# Patient Record
Sex: Male | Born: 1980 | Race: White | Hispanic: No | Smoking: Former smoker
Health system: Southern US, Community
[De-identification: ages and names within clinical notes are randomized; demographics above are authoritative.]

## PROBLEM LIST (undated history)

## (undated) DIAGNOSIS — J449 Chronic obstructive pulmonary disease, unspecified: Secondary | ICD-10-CM

## (undated) DIAGNOSIS — J45909 Unspecified asthma, uncomplicated: Secondary | ICD-10-CM

---

## 2006-11-12 ENCOUNTER — Emergency Department: Payer: Self-pay

## 2007-02-04 ENCOUNTER — Emergency Department: Payer: Self-pay | Admitting: Unknown Physician Specialty

## 2020-12-10 ENCOUNTER — Encounter (HOSPITAL_COMMUNITY): Payer: Self-pay | Admitting: Emergency Medicine

## 2020-12-10 ENCOUNTER — Emergency Department (HOSPITAL_COMMUNITY): Payer: No Typology Code available for payment source

## 2020-12-10 ENCOUNTER — Emergency Department (HOSPITAL_COMMUNITY)
Admission: EM | Admit: 2020-12-10 | Discharge: 2020-12-10 | Disposition: A | Discharge status: Home / Self Care | Payer: No Typology Code available for payment source | Attending: Emergency Medicine | Admitting: Emergency Medicine

## 2020-12-10 DIAGNOSIS — Z20822 Contact with and (suspected) exposure to covid-19: Secondary | ICD-10-CM | POA: Insufficient documentation

## 2020-12-10 DIAGNOSIS — T1490XA Injury, unspecified, initial encounter: Secondary | ICD-10-CM

## 2020-12-10 DIAGNOSIS — R519 Headache, unspecified: Secondary | ICD-10-CM | POA: Diagnosis not present

## 2020-12-10 DIAGNOSIS — Y92410 Unspecified street and highway as the place of occurrence of the external cause: Secondary | ICD-10-CM | POA: Insufficient documentation

## 2020-12-10 DIAGNOSIS — J45909 Unspecified asthma, uncomplicated: Secondary | ICD-10-CM | POA: Insufficient documentation

## 2020-12-10 HISTORY — DX: Unspecified asthma, uncomplicated: J45.909

## 2020-12-10 LAB — CBC
HCT: 42.9 % (ref 39.0–52.0)
Hemoglobin: 14.2 g/dL (ref 13.0–17.0)
MCH: 30.7 pg (ref 26.0–34.0)
MCHC: 33.1 g/dL (ref 30.0–36.0)
MCV: 92.9 fL (ref 80.0–100.0)
Platelets: 346 10*3/uL (ref 150–400)
RBC: 4.62 MIL/uL (ref 4.22–5.81)
RDW: 12.4 % (ref 11.5–15.5)
WBC: 7.9 10*3/uL (ref 4.0–10.5)
nRBC: 0 % (ref 0.0–0.2)

## 2020-12-10 LAB — I-STAT CHEM 8, ED
BUN: 21 mg/dL — ABNORMAL HIGH (ref 6–20)
Calcium, Ion: 1.16 mmol/L (ref 1.15–1.40)
Chloride: 100 mmol/L (ref 98–111)
Creatinine, Ser: 1 mg/dL (ref 0.61–1.24)
Glucose, Bld: 104 mg/dL — ABNORMAL HIGH (ref 70–99)
HCT: 42 % (ref 39.0–52.0)
Hemoglobin: 14.3 g/dL (ref 13.0–17.0)
Potassium: 3.6 mmol/L (ref 3.5–5.1)
Sodium: 138 mmol/L (ref 135–145)
TCO2: 23 mmol/L (ref 22–32)

## 2020-12-10 LAB — COMPREHENSIVE METABOLIC PANEL
ALT: 58 U/L — ABNORMAL HIGH (ref 0–44)
AST: 39 U/L (ref 15–41)
Albumin: 3.8 g/dL (ref 3.5–5.0)
Alkaline Phosphatase: 61 U/L (ref 38–126)
Anion gap: 12 (ref 5–15)
BUN: 19 mg/dL (ref 6–20)
CO2: 24 mmol/L (ref 22–32)
Calcium: 9.2 mg/dL (ref 8.9–10.3)
Chloride: 101 mmol/L (ref 98–111)
Creatinine, Ser: 1.13 mg/dL (ref 0.61–1.24)
GFR, Estimated: >60 mL/min (ref >60)
Glucose, Bld: 104 mg/dL — ABNORMAL HIGH (ref 70–99)
Potassium: 3.6 mmol/L (ref 3.5–5.1)
Sodium: 137 mmol/L (ref 135–145)
Total Bilirubin: 1 mg/dL (ref 0.3–1.2)
Total Protein: 6.6 g/dL (ref 6.5–8.1)

## 2020-12-10 LAB — LACTIC ACID, PLASMA
Lactic Acid, Venous: 5.6 mmol/L (ref 0.5–1.9)
Lactic Acid, Venous: 1.2 mmol/L (ref 0.5–1.9)

## 2020-12-10 LAB — ETHANOL: Alcohol, Ethyl (B): <10 mg/dL (ref <10)

## 2020-12-10 LAB — PROTIME-INR
INR: 1 (ref 0.8–1.2)
Prothrombin Time: 12.4 seconds (ref 11.4–15.2)

## 2020-12-10 LAB — SAMPLE TO BLOOD BANK

## 2020-12-10 LAB — RESP PANEL BY RT-PCR (FLU A&B, COVID) ARPGX2
Influenza A by PCR: NEGATIVE
Influenza B by PCR: NEGATIVE
SARS Coronavirus 2 by RT PCR: NEGATIVE

## 2020-12-10 MED ORDER — TETANUS-DIPHTH-ACELL PERTUSSIS 5-2.5-18.5 LF-MCG/0.5 IM SUSY
0.5000 mL | PREFILLED_SYRINGE | Freq: Once | INTRAMUSCULAR | Status: DC
  Filled 2020-12-10: qty 0.5

## 2020-12-10 MED ORDER — ONDANSETRON HCL 4 MG/2ML IJ SOLN: 4.0000 mg | Freq: Once | INTRAMUSCULAR | Status: AC

## 2020-12-10 MED ORDER — MORPHINE SULFATE (PF) 4 MG/ML IV SOLN
INTRAVENOUS | Status: AC
  Administered 2020-12-10: 4 mg via INTRAVENOUS
  Filled 2020-12-10: qty 1

## 2020-12-10 MED ORDER — ACETAMINOPHEN 500 MG PO TABS: 500.0000 mg | ORAL_TABLET | Freq: Four times a day (QID) | ORAL | 0 refills | Status: DC | PRN

## 2020-12-10 MED ORDER — SODIUM CHLORIDE 0.9 % IV BOLUS
1000.0000 mL | Freq: Once | INTRAVENOUS | Status: AC
  Administered 2020-12-10: 1000 mL via INTRAVENOUS

## 2020-12-10 MED ORDER — SODIUM CHLORIDE 0.9 % IV SOLN: INTRAVENOUS | Status: DC

## 2020-12-10 MED ORDER — ONDANSETRON HCL 4 MG/2ML IJ SOLN
INTRAMUSCULAR | Status: AC
  Administered 2020-12-10: 4 mg via INTRAVENOUS
  Filled 2020-12-10: qty 2

## 2020-12-10 MED ORDER — IOHEXOL 300 MG/ML  SOLN
100.0000 mL | Freq: Once | INTRAMUSCULAR | Status: AC | PRN
  Administered 2020-12-10: 100 mL via INTRAVENOUS

## 2020-12-10 MED ORDER — MORPHINE SULFATE (PF) 4 MG/ML IV SOLN
4.0000 mg | Freq: Once | INTRAVENOUS | Status: AC
Start: 2020-12-10 — End: 2020-12-10
  Administered 2020-12-10: 4 mg via INTRAVENOUS
  Filled 2020-12-10: qty 1

## 2020-12-10 MED ORDER — METHOCARBAMOL 500 MG PO TABS: 500.0000 mg | ORAL_TABLET | Freq: Two times a day (BID) | ORAL | 0 refills | Status: DC

## 2020-12-10 MED ORDER — MORPHINE SULFATE (PF) 4 MG/ML IV SOLN
4.0000 mg | Freq: Once | INTRAVENOUS | Status: AC
Start: 2020-12-10 — End: 2020-12-10

## 2020-12-10 NOTE — ED Notes (Signed)
Patient able to tolerate PO during fluid challenge.
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
## 2020-12-10 NOTE — ED Notes (Signed)
Dr Silverio Lay informed of patient wanting to leave.
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
## 2020-12-10 NOTE — ED Notes (Signed)
Pt ambulated in hall without the need of any assistance.

## 2020-12-10 NOTE — ED Notes (Signed)
[employer] took belongings from room.

## 2021-11-19 ENCOUNTER — Emergency Department: Payer: Self-pay

## 2021-11-19 ENCOUNTER — Other Ambulatory Visit: Payer: Self-pay

## 2021-11-19 ENCOUNTER — Encounter: Payer: Self-pay | Admitting: Emergency Medicine

## 2021-11-19 ENCOUNTER — Inpatient Hospital Stay
Admission: EM | Admit: 2021-11-19 | Discharge: 2021-11-20 | DRG: 191 | Discharge status: Against Medical Advice | Payer: Self-pay | Attending: Internal Medicine | Admitting: Internal Medicine

## 2021-11-19 DIAGNOSIS — R7989 Other specified abnormal findings of blood chemistry: Secondary | ICD-10-CM | POA: Diagnosis present

## 2021-11-19 DIAGNOSIS — Z5329 Procedure and treatment not carried out because of patient's decision for other reasons: Secondary | ICD-10-CM | POA: Diagnosis not present

## 2021-11-19 DIAGNOSIS — J9611 Chronic respiratory failure with hypoxia: Secondary | ICD-10-CM | POA: Diagnosis present

## 2021-11-19 DIAGNOSIS — J441 Chronic obstructive pulmonary disease with (acute) exacerbation: Principal | ICD-10-CM | POA: Diagnosis present

## 2021-11-19 DIAGNOSIS — Z20822 Contact with and (suspected) exposure to covid-19: Secondary | ICD-10-CM | POA: Diagnosis present

## 2021-11-19 DIAGNOSIS — N19 Unspecified kidney failure: Secondary | ICD-10-CM | POA: Diagnosis present

## 2021-11-19 DIAGNOSIS — Z888 Allergy status to other drugs, medicaments and biological substances status: Secondary | ICD-10-CM

## 2021-11-19 DIAGNOSIS — Z9981 Dependence on supplemental oxygen: Secondary | ICD-10-CM

## 2021-11-19 DIAGNOSIS — D72829 Elevated white blood cell count, unspecified: Secondary | ICD-10-CM | POA: Diagnosis present

## 2021-11-19 DIAGNOSIS — Z880 Allergy status to penicillin: Secondary | ICD-10-CM

## 2021-11-19 DIAGNOSIS — Z79899 Other long term (current) drug therapy: Secondary | ICD-10-CM

## 2021-11-19 DIAGNOSIS — Z87891 Personal history of nicotine dependence: Secondary | ICD-10-CM

## 2021-11-19 DIAGNOSIS — R0602 Shortness of breath: Secondary | ICD-10-CM | POA: Diagnosis present

## 2021-11-19 HISTORY — DX: Chronic obstructive pulmonary disease, unspecified: J44.9

## 2021-11-19 LAB — CBC WITH DIFFERENTIAL/PLATELET
Abs Immature Granulocytes: 0.05 10*3/uL (ref 0.00–0.07)
Basophils Absolute: 0.1 10*3/uL (ref 0.0–0.1)
Basophils Relative: 0 %
Eosinophils Absolute: 1.4 10*3/uL — ABNORMAL HIGH (ref 0.0–0.5)
Eosinophils Relative: 10 %
HCT: 44.2 % (ref 39.0–52.0)
Hemoglobin: 14 g/dL (ref 13.0–17.0)
Immature Granulocytes: 0 %
Lymphocytes Relative: 10 %
Lymphs Abs: 1.5 10*3/uL (ref 0.7–4.0)
MCH: 29.4 pg (ref 26.0–34.0)
MCHC: 31.7 g/dL (ref 30.0–36.0)
MCV: 92.9 fL (ref 80.0–100.0)
Monocytes Absolute: 0.8 10*3/uL (ref 0.1–1.0)
Monocytes Relative: 5 %
Neutro Abs: 10.7 10*3/uL — ABNORMAL HIGH (ref 1.7–7.7)
Neutrophils Relative %: 75 %
Platelets: 368 10*3/uL (ref 150–400)
RBC: 4.76 MIL/uL (ref 4.22–5.81)
RDW: 13.1 % (ref 11.5–15.5)
WBC: 14.5 10*3/uL — ABNORMAL HIGH (ref 4.0–10.5)
nRBC: 0 % (ref 0.0–0.2)

## 2021-11-19 LAB — BASIC METABOLIC PANEL
Anion gap: 8 (ref 5–15)
BUN: 24 mg/dL — ABNORMAL HIGH (ref 6–20)
CO2: 29 mmol/L (ref 22–32)
Calcium: 9 mg/dL (ref 8.9–10.3)
Chloride: 100 mmol/L (ref 98–111)
Creatinine, Ser: 0.66 mg/dL (ref 0.61–1.24)
GFR, Estimated: >60 mL/min (ref >60)
Glucose, Bld: 106 mg/dL — ABNORMAL HIGH (ref 70–99)
Potassium: 4.2 mmol/L (ref 3.5–5.1)
Sodium: 137 mmol/L (ref 135–145)

## 2021-11-19 LAB — MAGNESIUM: Magnesium: 2.4 mg/dL (ref 1.7–2.4)

## 2021-11-19 LAB — RESP PANEL BY RT-PCR (FLU A&B, COVID) ARPGX2
Influenza A by PCR: NEGATIVE
Influenza B by PCR: NEGATIVE
SARS Coronavirus 2 by RT PCR: NEGATIVE

## 2021-11-19 MED ORDER — DEXAMETHASONE SODIUM PHOSPHATE 10 MG/ML IJ SOLN
10.0000 mg | Freq: Once | INTRAMUSCULAR | Status: AC
Start: 2021-11-19 — End: 2021-11-19
  Administered 2021-11-19: 10 mg via INTRAMUSCULAR
  Filled 2021-11-19: qty 1

## 2021-11-19 MED ORDER — IPRATROPIUM-ALBUTEROL 0.5-2.5 (3) MG/3ML IN SOLN
3.0000 mL | Freq: Once | RESPIRATORY_TRACT | Status: AC
  Administered 2021-11-19: 3 mL via RESPIRATORY_TRACT
  Filled 2021-11-19: qty 3

## 2021-11-19 MED ORDER — ALBUTEROL SULFATE (2.5 MG/3ML) 0.083% IN NEBU
5.0000 mg | INHALATION_SOLUTION | Freq: Once | RESPIRATORY_TRACT | Status: DC
  Filled 2021-11-19: qty 6

## 2021-11-19 MED ORDER — SODIUM CHLORIDE 0.9 % IV BOLUS
1000.0000 mL | Freq: Once | INTRAVENOUS | Status: AC
  Administered 2021-11-19: 1000 mL via INTRAVENOUS

## 2021-11-19 MED ORDER — IPRATROPIUM-ALBUTEROL 0.5-2.5 (3) MG/3ML IN SOLN
3.0000 mL | Freq: Once | RESPIRATORY_TRACT | Status: AC
  Administered 2021-11-20: 3 mL via RESPIRATORY_TRACT
  Filled 2021-11-19: qty 3

## 2021-11-19 MED ORDER — ACETAMINOPHEN 325 MG RE SUPP: 650.0000 mg | Freq: Four times a day (QID) | RECTAL | Status: DC | PRN

## 2021-11-19 MED ORDER — ACETAMINOPHEN 325 MG PO TABS: 650.0000 mg | ORAL_TABLET | Freq: Four times a day (QID) | ORAL | Status: DC | PRN

## 2021-11-19 MED ORDER — IPRATROPIUM-ALBUTEROL 0.5-2.5 (3) MG/3ML IN SOLN: 3.0000 mL | Freq: Four times a day (QID) | RESPIRATORY_TRACT | Status: DC

## 2021-11-19 MED ORDER — SODIUM CHLORIDE 0.9 % IV SOLN: 500.0000 mg | INTRAVENOUS | Status: DC

## 2021-11-19 MED ORDER — METHYLPREDNISOLONE SODIUM SUCC 125 MG IJ SOLR: 80.0000 mg | Freq: Two times a day (BID) | INTRAMUSCULAR | Status: DC

## 2021-11-19 MED ORDER — ALBUTEROL SULFATE (2.5 MG/3ML) 0.083% IN NEBU
2.5000 mg | INHALATION_SOLUTION | RESPIRATORY_TRACT | Status: DC | PRN
Start: 2021-11-19 — End: 2021-11-20

## 2021-11-19 MED ORDER — MAGNESIUM SULFATE 2 GM/50ML IV SOLN
2.0000 g | Freq: Once | INTRAVENOUS | Status: DC
Start: 2021-11-19 — End: 2021-11-20
  Filled 2021-11-19: qty 50

## 2021-11-19 NOTE — ED Provider Triage Note (Signed)
Emergency Medicine Provider Triage Evaluation Note  Alex Conner , a 41 y.o. male  was evaluated in triage.  Pt complains of shortness of breath, patient wears O2 at home..  Review of Systems  Positive: Shortness of breath Negative: Fever  Physical Exam  There were no vitals taken for this visit. Gen:   Awake, mild respiratory distress Resp:  Increased respiratory effort, positive audible wheezing, wheezing noted all lung fields MSK:   Moves extremities without difficulty  Other:    Medical Decision Making  Medically screening exam initiated at 6:42 PM.  Appropriate orders placed.  Alex Conner was informed that the remainder of the evaluation will be completed by another provider, this initial triage assessment does not replace that evaluation, and the importance of remaining in the ED until their evaluation is complete.  3 DuoNebs, Decadron 10 mg IM   Faythe Ghee, PA-C 11/19/21 1843

## 2021-11-19 NOTE — ED Triage Notes (Signed)
Pt via POV from home. Pt on arrival expiratory wheezing and SOB. Pt states its been going on for a while and this past couple it got worse. Denies pain. Pt is A&OX4    Alex Pikes PA in triage. Pt wear 3L [place] chronically.

## 2021-11-19 NOTE — ED Notes (Signed)
Pt yelling cussing, states  he wants a blanket and has been ringing for 5 minutes for a blanket. Pt continues to cuss and yell. Pt informed no need to swear and the nurses are busy with life saving procedures. Pt provided with two warm blankets.

## 2021-11-19 NOTE — H&P (Addendum)
History and Physical    PLEASE NOTE THAT DRAGON DICTATION SOFTWARE WAS USED IN THE CONSTRUCTION OF THIS NOTE.   Alex Conner [id number] DOB: 1981/01/01 DOA: 11/19/2021  PCP: Pcp, No (will further assess) Patient coming from: home   I have personally briefly reviewed patient's old medical records in [place]  Chief Complaint: Shortness of breath  HPI: Alex Conner is a 41 y.o. male with medical history significant for chronic hypoxic respiratory failure on 2 to 3 L continuous nasal cannula, severe COPD, who is admitted to [facility] on 11/19/2021 with suspected acute COPD exacerbation after presenting from home to [facility] ED complaining of shortness of breath.   The patient reports 4 to 5 days of progressive shortness of breath associated with new onset wheezing and nonproductive cough in the absence of any associated hemoptysis. Denies any associated orthopnea, PND, or new onset peripheral edema. No recent chest pain, diaphoresis, palpitations, N/V, pre-syncope, or syncope. No new lower extremity erythema, or calf tenderness. Denies any recent trauma, travel, surgical procedures, or periods of prolonged diminished ambulatory status. No recent melena or hematochezia.   Denies any associated subjective fever, chills, rigors, or generalized myalgias. No recent headache, neck stiffness, rhinitis, rhinorrhea, sore throat, abdominal pain, diarrhea, or rash. No known recent COVID-19 exposures. Denies dysuria, gross hematuria, or change in urinary urgency/frequency.   He confirms a history of chronic hypoxic respiratory failure on 2 to 3 L continuous nasal cannula as an outpatient.  He also conveys that his outpatient respiratory regimen consists of prn albuterol inhaler in the absence of any scheduled respiratory medications.  He conveys that he is a former smoker.      ED Course:  Vital signs in the ED were notable for the following: Afebrile; heart rate 71-78; blood pressure 117/86;  respiratory rate 18-22, oxygen saturation 99% on his baseline 2 to 3 L nasal cannula.  Labs were notable for the following: BMP notable for the following: Sodium 137, bicarbonate 29, creatinine 0.66, BUN to creatinine ratio 36.4, glucose 106.  CBC notable for white blood cell count 14,500, hemoglobin 14.  COVID-19/influenza PCR negative  Imaging and additional notable ED work-up: Chest x-ray shows mild bronchitic changes with minimal left basilar atelectasis, without evidence of infiltrate, edema effusion, or pneumothorax.  While in the ED, the following were administered: Decadron 10 mg IM x1, duo nebulizer treatment x1, normal saline x1 L bolus, magnesium sulfate 2 g IV every 2 hours x1 dose.  Subsequently, the patient was admitted for further evaluation management of suspected presenting acute COPD exacerbation.      Review of Systems: As per HPI otherwise 10 point review of systems negative.   Past Medical History:  Diagnosis Date   Asthma    COPD (chronic obstructive pulmonary disease) ([place])     History reviewed. No pertinent surgical history.  Social History:  reports that he has quit smoking. His smoking use included cigarettes. He has never used smokeless tobacco. He reports that he does not currently use alcohol. He reports that he does not currently use drugs.   Allergies  Allergen Reactions   Nsaids Hives   Penicillins Other (See Comments)    Per pt: causes his asthma to flare up    History reviewed. No pertinent family history.  Family history reviewed and not pertinent    Prior to Admission medications   Medication Sig Start Date End Date Taking? Authorizing Provider  albuterol (VENTOLIN HFA) 108 (90 Base) MCG/ACT inhaler SMARTSIG:2 Puff(s) By  Mouth Every 6 Hours PRN 10/28/21  Yes [provider]  carvedilol (COREG) 3.125 MG tablet Take 3.125 mg by mouth 2 (two) times daily. Patient not taking: Reported on 11/19/2021 09/23/21   [provider]   lisinopril (ZESTRIL) 5 MG tablet Take 5 mg by mouth daily. Patient not taking: Reported on 11/19/2021 09/23/21   [provider]  predniSONE (DELTASONE) 20 MG tablet Take 40 mg by mouth every morning. Patient not taking: Reported on 11/19/2021 10/28/21   [provider]     Objective    Physical Exam: Vitals:   11/19/21 1841 11/19/21 1851 11/19/21 1900  BP: (!) 120/59  117/86  Pulse: 71  78  Resp: (!) 22  18  TempSrc: Axillary    SpO2: 92%  99%  Weight:  72.6 kg   Height:  6\' 1"  (1.854 m)     General: appears to be stated age; alert, oriented; increased work of breathing noted Skin: warm, dry, no rash Head:  AT/ Mouth:  Oral mucosa membranes appear dry, normal dentition Neck: supple; trachea midline Heart:  RRR; did not appreciate any M/R/G Lungs: Bilateral expiratory wheezes noted, but otherwise CTAB, did not appreciate any rales, or rhonchi Abdomen: + BS; soft, ND, NT Vascular: 2+ pedal pulses b/l; 2+ radial pulses b/l Extremities: no peripheral edema, no muscle wasting Neuro: strength and sensation intact in upper and lower extremities b/l     Labs on Admission: I have personally reviewed following labs and imaging studies  CBC: Recent Labs  Lab 11/19/21 1903  WBC 14.5*  NEUTROABS 10.7*  HGB 14.0  HCT 44.2  MCV 92.9  PLT 123XX123   Basic Metabolic Panel: Recent Labs  Lab 11/19/21 1856 11/19/21 1903  NA  --  137  K  --  4.2  CL  --  100  CO2  --  29  GLUCOSE  --  106*  BUN  --  24*  CREATININE  --  0.66  CALCIUM  --  9.0  MG 2.4  --    GFR: Estimated Creatinine Clearance: 126 mL/min (by C-G formula based on SCr of 0.66 mg/dL). Liver Function Tests: No results for input(s): AST, ALT, ALKPHOS, BILITOT, PROT, ALBUMIN in the last 168 hours. No results for input(s): LIPASE, AMYLASE in the last 168 hours. No results for input(s): AMMONIA in the last 168 hours. Coagulation Profile: No results for input(s): INR, PROTIME in the last 168  hours. Cardiac Enzymes: No results for input(s): CKTOTAL, CKMB, CKMBINDEX, TROPONINI in the last 168 hours. BNP (last 3 results) No results for input(s): PROBNP in the last 8760 hours. HbA1C: No results for input(s): HGBA1C in the last 72 hours. CBG: No results for input(s): GLUCAP in the last 168 hours. Lipid Profile: No results for input(s): CHOL, HDL, LDLCALC, TRIG, CHOLHDL, LDLDIRECT in the last 72 hours. Thyroid Function Tests: No results for input(s): TSH, T4TOTAL, FREET4, T3FREE, THYROIDAB in the last 72 hours. Anemia Panel: No results for input(s): VITAMINB12, FOLATE, FERRITIN, TIBC, IRON, RETICCTPCT in the last 72 hours. Urine analysis: No results found for: COLORURINE, APPEARANCEUR, [place], [place], [place], [place], BILIRUBINUR, KETONESUR, PROTEINUR, UROBILINOGEN, NITRITE, LEUKOCYTESUR  Radiological Exams on Admission: DG Chest Portable 1 View  Result Date: 11/19/2021 CLINICAL DATA:  Wheezing, shortness of breath EXAM: PORTABLE CHEST 1 VIEW COMPARISON:  12/10/2020 FINDINGS: Heart is normal size. Mild peribronchial thickening. Minimal left base atelectasis. No effusions or acute bony abnormality. IMPRESSION: Mild bronchitic changes.  Left base atelectasis. Electronically Signed   By:  Rolm Baptise M.D.   On: 11/19/2021 19:18     Assessment/Plan    Principal Problem:   Acute exacerbation of chronic obstructive pulmonary disease (COPD) (HCC) Active Problems:   SOB (shortness of breath)   Chronic respiratory failure with hypoxia (HCC)   Acute prerenal azotemia   Leukocytosis      #) Acute COPD exacerbation: in the context of a documented history of chronic hypoxic respiratory failure on 2 to 3 L nasal cannula in the setting of severe COPD, diagnosis of acute exacerbation on the basis of 4 to 5 days of progressive shortness of breath associated with wheezing, new onset nonproductive cough, evidence of increased work of breathing, with presenting CXR showing no evidence of  acute cardiopulmonary process, including no evidence of infiltrate, edema, effusion, or pneumothorax. Etiology of exac not entirely clear at this time although there may be opportunity for optimization of his outpatient respiratory regimen given underlying severe COPD with current outpatient regimen consisting of exclusively at prn albuterol inhaler as opposed to any scheduled breathing treatments, including of scheduled LABA or LAMA.  Will chest x-ray shows only minimal left basilar atelectasis without infiltrate, will add on procalcitonin level given presenting leukocytosis as means of further assessing for underlying contributory pneumonia.  COVID-19/influenza PCR negative.  Of note, patient maintaining O2 sats in the mid to high 90s on his baseline 2 to 3 L continuous nasal cannula.  no clinical or radiographic evidence to suggest acutely decompensated heart failure at this time. Will also add-on bnp. Additionally, ACS appears less likely in the absence of chest pain, but will also check EKG to further assess. Clinically, acute PE also appears to be less likely at this time.  Patient conveys that he is a former smoker.    Plan: monitor continuous pulse oxymetry. Monitor on telemetry. Solumedrol. Scheduled duonebs q6 hours. Prn albuterol inhaler. BMP in the morning. Repeat CBC in the morning.  Continue the 2 g of IV magnesium sulfate this been started in the ED this evening.  Check serum Mg and Phos levels. Will attempt additional chart review to evaluate most recent PFT results. Will start azithromycin for benefit of shortened duration of hospitalization associated with antibiotic initiation in the setting of acute COPD exacerbation including for associated anti-inflammatory benefits. Check blood gas. Add-on procalcitonin.  Check EKG, BNP.       #) Acute prerenal azotemia: Presenting labs reflect acute prerenal azotemia, with some clinical evidence to suggest underlying dehydration, including that  of dry oral mucous membranes.  Denies any recent GI losses, and no evidence of hypotension.  We will continue to IV fluid bolus initiated in the ED this evening, and closely monitor ensuing volume status and trend in renal function.  Plan: Continue IV fluids, as above.  Repeat BMP in the morning.  Monitor strict I's and O's and daily weights.       #) Leukocytosis: Presenting with blood cell count of 14,500.  Clinically, no evidence of underlying infectious process at this time, including chest x-ray showed no evidence of infiltrate.  Checking procalcitonin level to further assess for any underlying pneumonia.  Differential would also include leukocytosis on the basis of recent systemic corticosteroid use, although the patient denies any recent such use.  No additional source of underlying infection suspected at this time, including no acute urinary symptoms, while COVID-19/influenza PCR found to be negative.  In general, will continue with the IV fluids, as above, and check procalcitonin level.  Plan: IV fluids, as  above.  Check procalcitonin.  Monitor strict I's and O's diabetes.  Repeat CBC with differential in the morning.      DVT prophylaxis: SCD's   Code Status: Full code Family Communication: none Disposition Plan: Per Rounding Team Consults called: none;  Admission status: Inpatient;    PLEASE NOTE THAT DRAGON DICTATION SOFTWARE WAS USED IN THE CONSTRUCTION OF THIS NOTE.   [place] DO [facility] [place]   11/19/2021, 11:46 PM

## 2021-11-19 NOTE — ED Provider Notes (Signed)
[facility] Provider Note    Event Date/Time   First MD Initiated Contact with Patient 11/19/21 2321     (approximate)   History   Shortness of breath  HPI  Alex Conner is a 41 y.o. male with a past history of COPD on 2 L nasal cannula oxygen at home who comes ED complaining of worsening shortness of breath for the past 2 days, associated nonproductive cough.  No significant chest pain, no fever.  No vomiting or other pain complaints.  Reports that he had previously been on a steroid taper which ended about a week ago.     Physical Exam   Triage Vital Signs: ED Triage Vitals  Enc Vitals Group     BP 11/19/21 1841 (!) 120/59     Pulse Rate 11/19/21 1841 71     Resp 11/19/21 1841 (!) 22     Temp --      Temp Source 11/19/21 1841 Axillary     SpO2 11/19/21 1841 92 %     Weight 11/19/21 1851 160 lb (72.6 kg)     Height 11/19/21 1851 6\' 1"  (1.854 m)     Head Circumference --      Peak Flow --      Pain Score 11/19/21 1851 0     Pain Loc --      Pain Edu? --      Excl. in [place]? --     Most recent vital signs: Vitals:   11/19/21 1841 11/19/21 1900  BP: (!) 120/59 117/86  Pulse: 71 78  Resp: (!) 22 18  SpO2: 92% 99%     General: Awake, no distress.  CV:  Good peripheral perfusion.  Regular rate and rhythm. Resp:  Normal effort.  Diffuse expiratory wheezing, prolonged expiratory phase.  Accessory muscle use. Abd:  No distention.  Soft and nontender Other:  No lower extremity swelling or calf tenderness.   ED Results / Procedures / Treatments   Labs (all labs ordered are listed, but only abnormal results are displayed) Labs Reviewed  BASIC METABOLIC PANEL - Abnormal; Notable for the following components:      Result Value   Glucose, Bld 106 (*)    BUN 24 (*)    All other components within normal limits  CBC WITH DIFFERENTIAL/PLATELET - Abnormal; Notable for the following components:   WBC 14.5 (*)    Neutro Abs 10.7 (*)     Eosinophils Absolute 1.4 (*)    All other components within normal limits  RESP PANEL BY RT-PCR (FLU A&B, COVID) ARPGX2  MAGNESIUM  HIV ANTIBODY (ROUTINE TESTING W REFLEX)  MAGNESIUM  PHOSPHORUS  COMPREHENSIVE METABOLIC PANEL  CBC WITH DIFFERENTIAL/PLATELET  BLOOD GAS, VENOUS  PROCALCITONIN  BRAIN NATRIURETIC PEPTIDE     EKG  Interpreted by me    RADIOLOGY Chest x-ray viewed interpreted by me, appears normal.  Radiology report reviewed.    PROCEDURES:  Critical Care performed: No  Procedures   MEDICATIONS ORDERED IN ED: Medications  magnesium sulfate IVPB 2 g 50 mL (has no administration in time range)  ipratropium-albuterol (DUONEB) 0.5-2.5 (3) MG/3ML nebulizer solution 3 mL (has no administration in time range)  albuterol (PROVENTIL) (2.5 MG/3ML) 0.083% nebulizer solution 5 mg (has no administration in time range)  acetaminophen (TYLENOL) tablet 650 mg (has no administration in time range)    Or  acetaminophen (TYLENOL) suppository 650 mg (has no administration in time range)  ipratropium-albuterol (DUONEB) 0.5-2.5 (3) MG/3ML nebulizer  solution 3 mL (has no administration in time range)  albuterol (PROVENTIL) (2.5 MG/3ML) 0.083% nebulizer solution 2.5 mg (has no administration in time range)  azithromycin (ZITHROMAX) 500 mg in sodium chloride 0.9 % 250 mL IVPB (has no administration in time range)  methylPREDNISolone sodium succinate (SOLU-MEDROL) 125 mg/2 mL injection 80 mg (has no administration in time range)  dexamethasone (DECADRON) injection 10 mg (10 mg Intramuscular Given 11/19/21 1859)  ipratropium-albuterol (DUONEB) 0.5-2.5 (3) MG/3ML nebulizer solution 3 mL (3 mLs Nebulization Given 11/19/21 1859)  ipratropium-albuterol (DUONEB) 0.5-2.5 (3) MG/3ML nebulizer solution 3 mL (3 mLs Nebulization Given 11/19/21 1859)  ipratropium-albuterol (DUONEB) 0.5-2.5 (3) MG/3ML nebulizer solution 3 mL (3 mLs Nebulization Given 11/19/21 1859)  sodium chloride 0.9 % bolus 1,000  mL (1,000 mLs Intravenous New Bag/Given 11/19/21 2000)     IMPRESSION / MDM / ASSESSMENT AND PLAN / ED COURSE  I reviewed the triage vital signs and the nursing notes.                              Differential diagnosis includes, but is not limited to, COPD exacerbation, viral illness, pneumonia    Patient presents with shortness of breath, exam consistent with COPD exacerbation.  Vital signs are unremarkable.  Does have increased work of breathing though oxygenation is stable on his usual supplemental oxygen.  Patient was given steroids, bronchodilators while obtaining work-up.  On reassessment, he still significantly short of breath with persistent wheezing.  He will require hospitalization for continued management.  I have ordered additional nebs and IV magnesium.  Case discussed with the hospitalist.     FINAL CLINICAL IMPRESSION(S) / ED DIAGNOSES   Final diagnoses:  COPD exacerbation ([place])  Chronic respiratory failure with hypoxia ([place])     Rx / DC Orders   ED Discharge Orders     None        Note:  This document was prepared using Dragon voice recognition software and may include unintentional dictation errors.   Carrie Mew, MD 11/19/21 [phone]

## 2021-11-20 DIAGNOSIS — D72829 Elevated white blood cell count, unspecified: Secondary | ICD-10-CM | POA: Diagnosis present

## 2021-11-20 DIAGNOSIS — R0602 Shortness of breath: Secondary | ICD-10-CM | POA: Diagnosis present

## 2021-11-20 DIAGNOSIS — N19 Unspecified kidney failure: Secondary | ICD-10-CM | POA: Diagnosis present

## 2021-11-20 DIAGNOSIS — J9611 Chronic respiratory failure with hypoxia: Secondary | ICD-10-CM | POA: Diagnosis present

## 2021-11-20 LAB — BRAIN NATRIURETIC PEPTIDE: B Natriuretic Peptide: 82.5 pg/mL (ref 0.0–100.0)

## 2021-11-20 LAB — PROCALCITONIN: Procalcitonin: <0.1 ng/mL

## 2021-11-20 NOTE — Discharge Summary (Signed)
Physician Discharge Summary  Alex Conner MWN:[id number] DOB: December 10, 1980 DOA: 11/19/2021  PCP: Oneita Hurt, No  Admit date: 11/19/2021 Discharge date: 11/20/2021 (patient left the hospital AGAINST MEDICAL ADVICE at this time)  Admitted From: Home Disposition: (Patient left AGAINST MEDICAL ADVICE)  Recommendations for Outpatient Follow-up: Patient left the hospital AGAINST MEDICAL ADVICE before standard discharge and associated opportunity to render outpatient follow-up recommendations  Home Health: no Equipment/Devices: Not discharged on any medical equipment, as the patient left the hospital AGAINST MEDICAL ADVICE   Discharge Condition: Guarded CODE STATUS: Full Diet recommendation: Regular   Brief/Interim Summary: 41 y.o. male with medical history significant for chronic hypoxic respiratory failure on 2 to 3 L continuous nasal cannula, severe COPD, who is admitted to [facility] on 11/19/2021 with suspected acute COPD exacerbation after presenting from home to [facility] ED complaining of 4 to 5 days of progressive shortness of breath associated with wheezing and increased work of breathing, refractory to increasing frequency of use of his home prn albuterol inhaler.  Not associate with any chest pain.  After receiving duo nebulizer treatment as well as IM Decadron in the emergency department, there is no significant improvement in the patient's breathing, with perpetuation of his wheezing, prompting admission to the hospitalist service for further evaluation and management of acute COPD exacerbation.  Ensuing hospital course, by problem, leading up to the patient leaving the hospital AGAINST MEDICAL ADVICE, is as follows:    #) Acute COPD exacerbation: in the context of a documented history of chronic hypoxic respiratory failure on 2 to 3 L nasal cannula in the setting of severe COPD, diagnosis of acute exacerbation on the basis of 4 to 5 days of progressive shortness of breath associated with wheezing, new  onset nonproductive cough, evidence of increased work of breathing, with presenting CXR showing no evidence of acute cardiopulmonary process, including no evidence of infiltrate, edema, effusion, or pneumothorax. Etiology of exac not entirely clear, although there was consideration for a potential opportunity for optimization of his outpatient respiratory regimen given underlying severe COPD with current outpatient regimen consisting of exclusively at prn albuterol inhaler as opposed to any scheduled breathing treatments, including of scheduled LABA or LAMA.    Presenting chest x-ray was suggestive of minimal left basilar atelectasis.  However, in the setting of presenting leukocytosis, further evaluation for underlying pneumonia was initiated.  Specifically, procalcitonin was ordered, although result of this lab was not available by the time the patient left the hospital AGAINST MEDICAL ADVICE. COVID-19/influenza PCR negative.  Of note, patient maintaining O2 sats in the mid to high 90s on his baseline 2 to 3 L continuous nasal cannula throughout his hospitalization leading up to leaving AGAINST MEDICAL ADVICE.   ACS was felt to be unlikely as a contributing factor leading to his presenting acute COPD exacerbation.  However, as further evaluation for such, EKG was ordered, however patient left the hospital AGAINST MEDICAL ADVICE prior to ability to obtain EKG.  He was started on solumedrol, scheduled duo nebulizer treatments, prn albuterol nebulizer, as well as azithromycin for the benefit of shortened duration of hospitalization associated with antibiotic initiation in the context of acute COPD exacerbation, as well as for its associated anti-inflammatory benefits.  He received 2 g of IV magnesium sulfate.  Serum magnesium and phosphorus levels were ordered, however his results were not available by the time the patient left the hospital AGAINST MEDICAL ADVICE.  Blood gas ordered, but was unable to be  completed by the time the patient  left AMA.  The patient verbalized his intent to leave the hospital AMA.  At that time I conveyed my strong recommendation to him that he remain in the hospital for further evaluation and management of his presenting acute COPD exacerbation, emphasizing the dangers of leaving the hospital AMA, including increased risk for further worsening of his acute COPD exacerbation, development of acute on chronic hypoxic respiratory failure, and increased risk for further complications relating to his presenting acute COPD exacerbation, up to and including the increased risk of death.  The patient verbalized his understanding of my recommendation to remain in the hospital for further evaluation management of acute COPD exacerbation as well as verbalized his understanding of the potential risks that he was incurring and electing to leave the hospital AMA.  However, he ultimately left the hospital AMA.as he left AMA, he did not receive prescription for oral steroids.     Discharge Diagnoses:  Principal Problem:   Acute exacerbation of chronic obstructive pulmonary disease (COPD) (HCC) Active Problems:   SOB (shortness of breath)   Chronic respiratory failure with hypoxia (HCC)   Acute prerenal azotemia   Leukocytosis    Discharge Instructions (None rendered as the patient left the hospital AGAINST MEDICAL ADVICE)   Allergies as of 11/20/2021       Reactions   Nsaids Hives   Penicillins Other (See Comments)   Per pt: causes his asthma to flare up     Med Rec must be completed prior to using this SMARTLINK       Allergies  Allergen Reactions   Nsaids Hives   Penicillins Other (See Comments)    Per pt: causes his asthma to flare up    Consultations: None Procedures/Studies: DG Chest Portable 1 View  Result Date: 11/19/2021 CLINICAL DATA:  Wheezing, shortness of breath EXAM: PORTABLE CHEST 1 VIEW COMPARISON:  12/10/2020 FINDINGS: Heart is normal size.  Mild peribronchial thickening. Minimal left base atelectasis. No effusions or acute bony abnormality. IMPRESSION: Mild bronchitic changes.  Left base atelectasis. Electronically Signed   By: Charlett Nose M.D.   On: 11/19/2021 19:18      Subjective:   Discharge Exam: Vitals:   11/19/21 1841 11/19/21 1900  BP: (!) 120/59 117/86  Pulse: 71 78  Resp: (!) 22 18  SpO2: 92% 99%   Vitals:   11/19/21 1841 11/19/21 1851 11/19/21 1900  BP: (!) 120/59  117/86  Pulse: 71  78  Resp: (!) 22  18  TempSrc: Axillary    SpO2: 92%  99%  Weight:  72.6 kg   Height:  6\' 1"  (1.854 m)    General: appears to be stated age; alert, oriented; increased work of breathing noted Skin: warm, dry, no rash Head:  AT/[place] Mouth:  Oral mucosa membranes appear dry, normal dentition Neck: supple; trachea midline Heart:  RRR; did not appreciate any M/R/G Lungs: Bilateral expiratory wheezes noted, but otherwise CTAB, did not appreciate any rales, or rhonchi Abdomen: + BS; soft, ND, NT Vascular: 2+ pedal pulses b/l; 2+ radial pulses b/l Extremities: no peripheral edema, no muscle wasting Neuro: strength and sensation intact in upper and lower extremities b/l    The results of significant diagnostics from this hospitalization (including imaging, microbiology, ancillary and laboratory) are listed below for reference.     Microbiology: Recent Results (from the past 240 hour(s))  Resp Panel by RT-PCR (Flu A&B, Covid) Nasopharyngeal Swab     Status: None   Collection Time: 11/19/21  7:03 PM  Specimen: Nasopharyngeal Swab; Nasopharyngeal(NP) swabs in vial transport medium  Result Value Ref Range Status   SARS Coronavirus 2 by RT PCR NEGATIVE NEGATIVE Final    Comment: (NOTE) SARS-CoV-2 target nucleic acids are NOT DETECTED.  The SARS-CoV-2 RNA is generally detectable in upper respiratory specimens during the acute phase of infection. The lowest concentration of SARS-CoV-2 viral copies this assay can detect  is 138 copies/mL. A negative result does not preclude SARS-Cov-2 infection and should not be used as the sole basis for treatment or other patient management decisions. A negative result may occur with  improper specimen collection/handling, submission of specimen other than nasopharyngeal swab, presence of viral mutation(s) within the areas targeted by this assay, and inadequate number of viral copies(<138 copies/mL). A negative result must be combined with clinical observations, patient history, and epidemiological information. The expected result is Negative.  Fact Sheet for Patients:  BloggerCourse.com  Fact Sheet for Healthcare Providers:  SeriousBroker.it  This test is no t yet approved or cleared by the [place] FDA and  has been authorized for detection and/or diagnosis of SARS-CoV-2 by FDA under an Emergency Use Authorization (EUA). This EUA will remain  in effect (meaning this test can be used) for the duration of the COVID-19 declaration under Section 564(b)(1) of the Act, 21 U.S.C.section 360bbb-3(b)(1), unless the authorization is terminated  or revoked sooner.       Influenza A by PCR NEGATIVE NEGATIVE Final   Influenza B by PCR NEGATIVE NEGATIVE Final    Comment: (NOTE) The Xpert Xpress SARS-CoV-2/FLU/RSV plus assay is intended as an aid in the diagnosis of influenza from Nasopharyngeal swab specimens and should not be used as a sole basis for treatment. Nasal washings and aspirates are unacceptable for Xpert Xpress SARS-CoV-2/FLU/RSV testing.  Fact Sheet for Patients: BloggerCourse.com  Fact Sheet for Healthcare Providers: SeriousBroker.it  This test is not yet approved or cleared by the [place] FDA and has been authorized for detection and/or diagnosis of SARS-CoV-2 by FDA under an Emergency Use Authorization (EUA). This EUA will remain in effect  (meaning this test can be used) for the duration of the COVID-19 declaration under Section 564(b)(1) of the Act, 21 U.S.C. section 360bbb-3(b)(1), unless the authorization is terminated or revoked.  Performed at [facility], [address] [place], [place] [address]      Labs: BNP (last 3 results) Recent Labs    11/19/21 1856  BNP 82.5   Basic Metabolic Panel: Recent Labs  Lab 11/19/21 1856 11/19/21 1903  NA  --  137  K  --  4.2  CL  --  100  CO2  --  29  GLUCOSE  --  106*  BUN  --  24*  CREATININE  --  0.66  CALCIUM  --  9.0  MG 2.4  --    Liver Function Tests: No results for input(s): AST, ALT, ALKPHOS, BILITOT, PROT, ALBUMIN in the last 168 hours. No results for input(s): LIPASE, AMYLASE in the last 168 hours. No results for input(s): AMMONIA in the last 168 hours. CBC: Recent Labs  Lab 11/19/21 1903  WBC 14.5*  NEUTROABS 10.7*  HGB 14.0  HCT 44.2  MCV 92.9  PLT 368   Cardiac Enzymes: No results for input(s): CKTOTAL, CKMB, CKMBINDEX, TROPONINI in the last 168 hours. BNP: Invalid input(s): POCBNP CBG: No results for input(s): GLUCAP in the last 168 hours. D-Dimer No results for input(s): DDIMER in the last 72 hours. Hgb A1c No results for  input(s): HGBA1C in the last 72 hours. Lipid Profile No results for input(s): CHOL, HDL, LDLCALC, TRIG, CHOLHDL, LDLDIRECT in the last 72 hours. Thyroid function studies No results for input(s): TSH, T4TOTAL, T3FREE, THYROIDAB in the last 72 hours.  Invalid input(s): FREET3 Anemia work up No results for input(s): VITAMINB12, FOLATE, FERRITIN, TIBC, IRON, RETICCTPCT in the last 72 hours. Urinalysis No results found for: COLORURINE, APPEARANCEUR, LABSPEC, PHURINE, GLUCOSEU, HGBUR, BILIRUBINUR, KETONESUR, PROTEINUR, UROBILINOGEN, NITRITE, LEUKOCYTESUR Sepsis Labs Invalid input(s): PROCALCITONIN,  WBC,  LACTICIDVEN Microbiology Recent Results (from the past 240 hour(s))  Resp Panel by RT-PCR (Flu A&B,  Covid) Nasopharyngeal Swab     Status: None   Collection Time: 11/19/21  7:03 PM   Specimen: Nasopharyngeal Swab; Nasopharyngeal(NP) swabs in vial transport medium  Result Value Ref Range Status   SARS Coronavirus 2 by RT PCR NEGATIVE NEGATIVE Final    Comment: (NOTE) SARS-CoV-2 target nucleic acids are NOT DETECTED.  The SARS-CoV-2 RNA is generally detectable in upper respiratory specimens during the acute phase of infection. The lowest concentration of SARS-CoV-2 viral copies this assay can detect is 138 copies/mL. A negative result does not preclude SARS-Cov-2 infection and should not be used as the sole basis for treatment or other patient management decisions. A negative result may occur with  improper specimen collection/handling, submission of specimen other than nasopharyngeal swab, presence of viral mutation(s) within the areas targeted by this assay, and inadequate number of viral copies(<138 copies/mL). A negative result must be combined with clinical observations, patient history, and epidemiological information. The expected result is Negative.  Fact Sheet for Patients:  BloggerCourse.comhttps://www.fda.gov/media/152166/download  Fact Sheet for Healthcare Providers:  SeriousBroker.ithttps://www.fda.gov/media/152162/download  This test is no t yet approved or cleared by the [place] FDA and  has been authorized for detection and/or diagnosis of SARS-CoV-2 by FDA under an Emergency Use Authorization (EUA). This EUA will remain  in effect (meaning this test can be used) for the duration of the COVID-19 declaration under Section 564(b)(1) of the Act, 21 U.S.C.section 360bbb-3(b)(1), unless the authorization is terminated  or revoked sooner.       Influenza A by PCR NEGATIVE NEGATIVE Final   Influenza B by PCR NEGATIVE NEGATIVE Final    Comment: (NOTE) The Xpert Xpress SARS-CoV-2/FLU/RSV plus assay is intended as an aid in the diagnosis of influenza from Nasopharyngeal swab specimens and should  not be used as a sole basis for treatment. Nasal washings and aspirates are unacceptable for Xpert Xpress SARS-CoV-2/FLU/RSV testing.  Fact Sheet for Patients: BloggerCourse.comhttps://www.fda.gov/media/152166/download  Fact Sheet for Healthcare Providers: SeriousBroker.ithttps://www.fda.gov/media/152162/download  This test is not yet approved or cleared by the [place] FDA and has been authorized for detection and/or diagnosis of SARS-CoV-2 by FDA under an Emergency Use Authorization (EUA). This EUA will remain in effect (meaning this test can be used) for the duration of the COVID-19 declaration under Section 564(b)(1) of the Act, 21 U.S.C. section 360bbb-3(b)(1), unless the authorization is terminated or revoked.  Performed at [facility], [address] [place], [place] [address]      Time coordinating discharge: Less than 30 minutes  SIGNED:   Angie FavaJustin B Nakea Gouger, DO  [facility] 11/20/2021, 12:53 AM

## 2021-11-20 NOTE — ED Notes (Signed)
Pt states that he wants to leave AMA. Pt has been aggressively cussing at this RN. MD made aware.

## 2021-11-20 NOTE — ED Notes (Signed)
Patient resting comfortably on stretcher with eyes closed. RR even and unlabored. Patient verbalizes no needs or complaints at this time.  °

## 2021-11-20 NOTE — ED Notes (Signed)
This nurse woke patient for morning labs. Patient immediately started yelling and cursing at this nurse, verbally abusive stating he has had the worst care. Patient demanded to leave AMA. This nurse notified Dr. Arlean Hopping who stated he was fine to leave because he had already spoken to him regardin the risks vs benefits. Patient refused initially to allow me to remove his INT until I explained that he could not leave with it. Patient complied Patient refused to sign AMA form.
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
## 2022-06-15 IMAGING — DX DG CHEST 1V PORT
1 series · 1 of 1 positions shown · non-contrast
Comparison: 12/10/2020

CLINICAL DATA: Wheezing, shortness of breath

EXAM:
PORTABLE CHEST 1 VIEW

[chest ap]
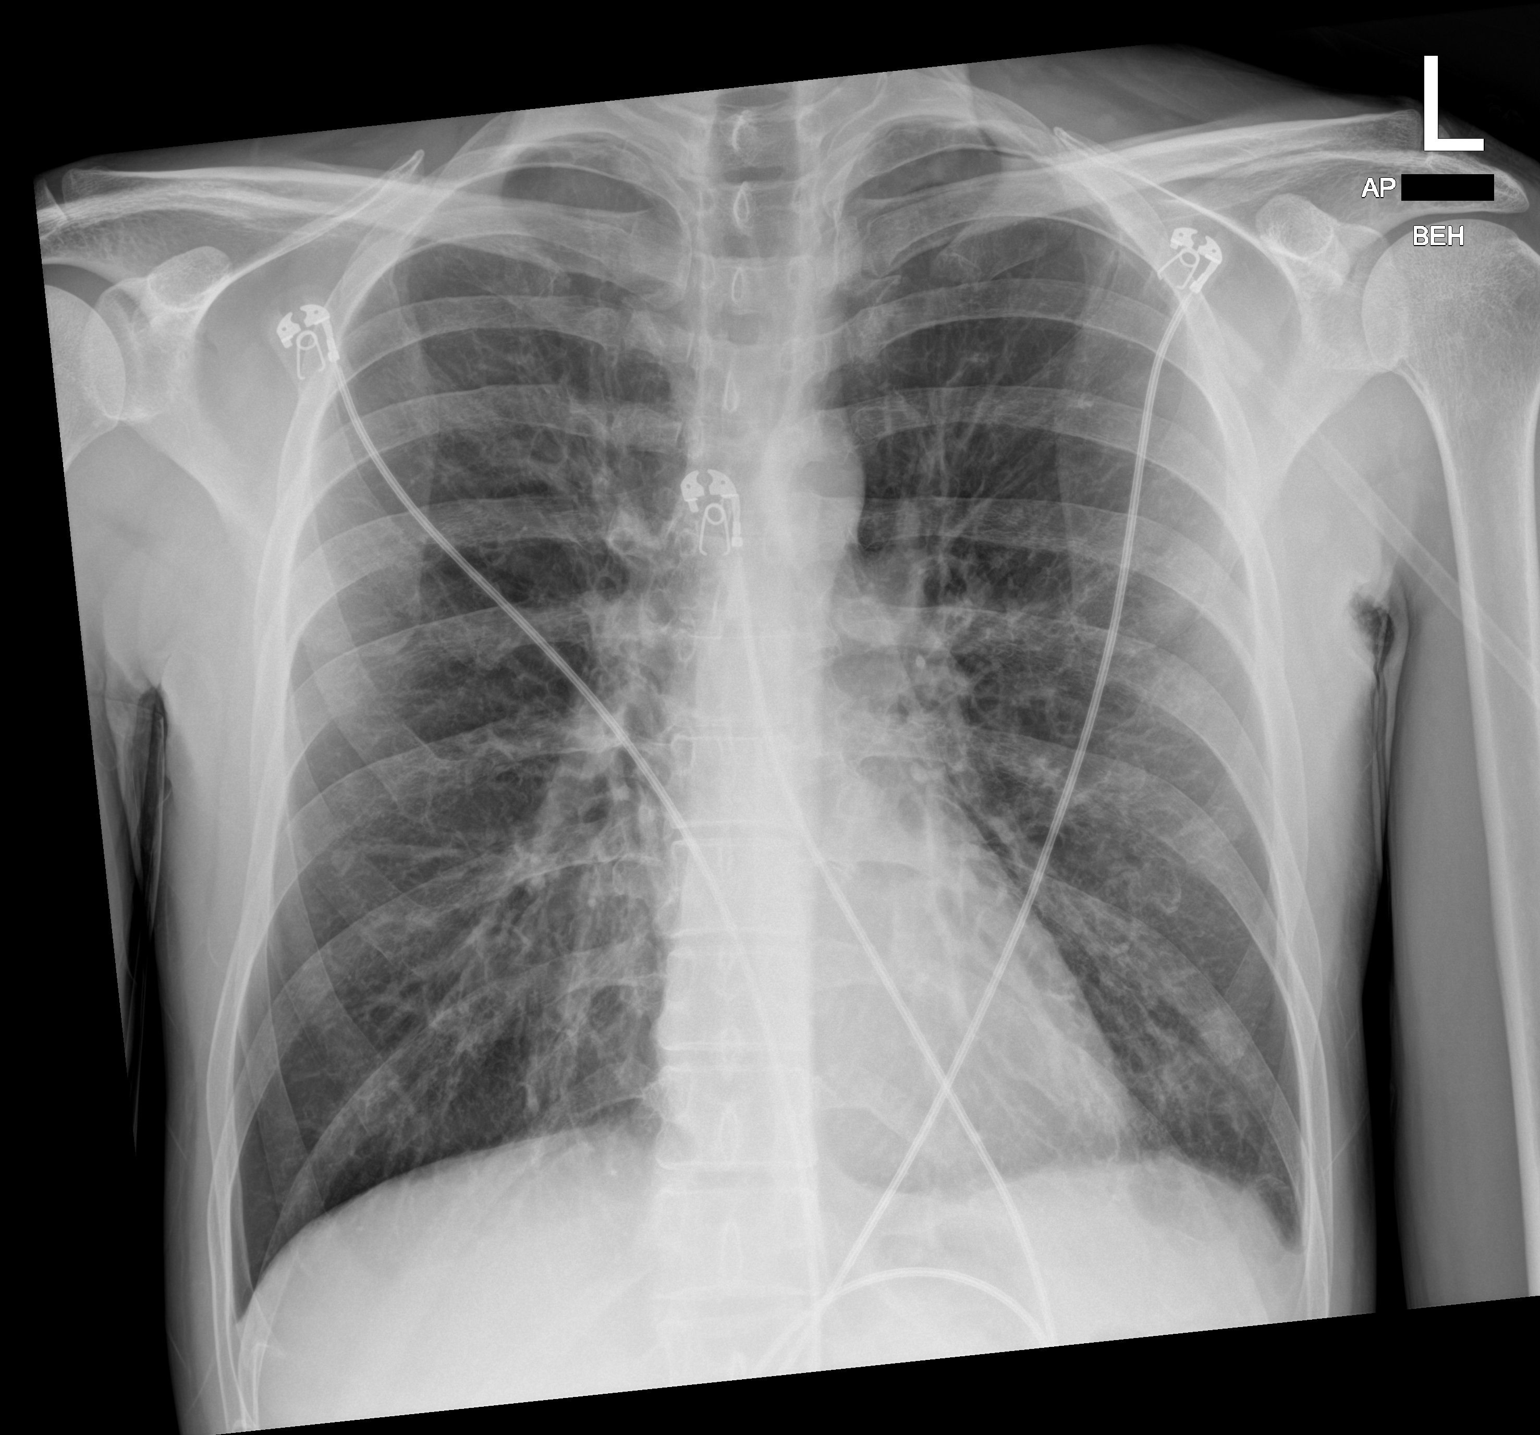

[1 of 1 positions shown; findings below may reference images not displayed]

FINDINGS: Heart is normal size. Mild peribronchial thickening. Minimal left
base atelectasis. No effusions or acute bony abnormality.
IMPRESSION: Mild bronchitic changes.  Left base atelectasis.
